# Patient Record
Sex: Female | Born: 1980 | Hispanic: Yes | Marital: Single | Smoking: Never smoker
Health system: Southern US, Community
[De-identification: ages and names within clinical notes are randomized; demographics above are authoritative.]

## PROBLEM LIST (undated history)

## (undated) DIAGNOSIS — E119 Type 2 diabetes mellitus without complications: Secondary | ICD-10-CM

---

## 2018-04-19 ENCOUNTER — Emergency Department (HOSPITAL_COMMUNITY)
Admission: EM | Admit: 2018-04-19 | Discharge: 2018-04-19 | Disposition: A | Discharge status: Home / Self Care | Payer: Self-pay | Attending: Emergency Medicine | Admitting: Emergency Medicine

## 2018-04-19 ENCOUNTER — Encounter (HOSPITAL_COMMUNITY): Payer: Self-pay

## 2018-04-19 DIAGNOSIS — R05 Cough: Secondary | ICD-10-CM | POA: Insufficient documentation

## 2018-04-19 DIAGNOSIS — B9789 Other viral agents as the cause of diseases classified elsewhere: Secondary | ICD-10-CM

## 2018-04-19 DIAGNOSIS — J069 Acute upper respiratory infection, unspecified: Secondary | ICD-10-CM | POA: Insufficient documentation

## 2018-04-19 DIAGNOSIS — B309 Viral conjunctivitis, unspecified: Secondary | ICD-10-CM | POA: Insufficient documentation

## 2018-04-19 DIAGNOSIS — R070 Pain in throat: Secondary | ICD-10-CM | POA: Insufficient documentation

## 2018-04-19 LAB — GROUP A STREP BY PCR: GROUP A STREP BY PCR: NOT DETECTED

## 2018-04-19 MED ORDER — ERYTHROMYCIN 5 MG/GM OP OINT: TOPICAL_OINTMENT | OPHTHALMIC | 0 refills | Status: AC

## 2018-04-19 MED ORDER — GUAIFENESIN 100 MG/5ML PO SYRP: 100.0000 mg | ORAL_SOLUTION | ORAL | 0 refills | Status: AC | PRN

## 2018-04-19 NOTE — ED Triage Notes (Signed)
Pt presents with c/o bilateral eye drainage and redness as well as burning in her esophagus. Pt reports she feels a lot of burning in her chest. She said she feels like she has acid in her throat and is unable to keep anything down. Pt reports no abdominal pain, says it is only in her esophagus and throat.

## 2018-04-19 NOTE — Discharge Instructions (Signed)
Le he recetado medicina para sus ojos, apliquela todos los dias por 7 dias. Tambien le he dado una receta para la tos. Si sus simptomas empeoran o siente que le falta el aire porfavor regrese a Radio broadcast assistant.

## 2018-04-19 NOTE — ED Provider Notes (Signed)
[facility]-EMERGENCY DEPT Provider Note   CSN: [id number] Arrival date & time: 04/19/18  1240     History   Chief Complaint Chief Complaint  Patient presents with  . Eye Drainage  . Throat pain    HPI Sara Woodward is a 38 y.o. female.  38 y.o female with no PMH to the ED with a chief complaint of burning sore throat, eye drainage x3 days.  Patient reports feeling a burning sensation in her throat that radiates down her esophagus worse with drinking or eating.  She also endorses some bilateral eye drainage which began 3 days ago, reports waking up with yellow to green discharge daily.  She has tried some Benadryl to help with her symptoms along with NyQuil and Mucinex.  Reports no relieving symptoms at this time.  Currently has a newborn infant at home.  Denies any shortness of breath, chest pain, back pain but reports body aches.  Denies getting a flu shot this season.     History reviewed. No pertinent past medical history.  There are no active problems to display for this patient.   History reviewed. No pertinent surgical history.   OB History   No obstetric history on file.      Home Medications    Prior to Admission medications   Medication Sig Start Date End Date Taking? Authorizing Provider  erythromycin ophthalmic ointment Place a 1/2 inch ribbon of ointment into the lower eyelid. 04/19/18   Claude MangesSoto, Robina Hamor, PA-C  guaifenesin (ROBITUSSIN) 100 MG/5ML syrup Take 5-10 mLs (100-200 mg total) by mouth every 4 (four) hours as needed for up to 5 days for cough. 04/19/18 04/24/18  Claude MangesSoto, Marliss Buttacavoli, PA-C    Family History History reviewed. No pertinent family history.  Social History Social History   Tobacco Use  . Smoking status: Never Smoker  . Smokeless tobacco: Never Used  Substance Use Topics  . Alcohol use: Never    Frequency: Never  . Drug use: Never     Allergies   Patient has no known allergies.   Review of Systems Review of  Systems  HENT: Positive for sore throat. Negative for rhinorrhea.   Eyes: Positive for discharge and redness.  Respiratory: Negative for shortness of breath.   Cardiovascular: Negative for chest pain.     Physical Exam Updated Vital Signs BP (!) 136/91 (BP Location: Left Arm)   Pulse 90   Temp 98.6 F (37 C)   Resp 18   Ht 5\' 5"  (1.651 m)   Wt 86.2 kg   LMP 04/04/2018 (Approximate)   SpO2 100%   BMI 31.62 kg/m   Physical Exam Vitals signs and nursing note reviewed.  Constitutional:      General: She is not in acute distress.    Appearance: She is well-developed.  HENT:     Head: Normocephalic and atraumatic.     Mouth/Throat:     Pharynx: Oropharynx is clear. Uvula midline. Posterior oropharyngeal erythema present. No pharyngeal swelling or oropharyngeal exudate.     Tonsils: Tonsillar exudate present. Swelling: 1+ on the right. 1+ on the left.     Comments: Bilateral tonsillar exudate along with erythema.  No tonsillar abscess or uvula swelling. Eyes:     Conjunctiva/sclera:     Right eye: Right conjunctiva is injected. No exudate.    Left eye: Left conjunctiva is injected. No exudate.    Pupils: Pupils are equal, round, and reactive to light.     Comments: Bilateral eye drainage,  injected bilaterally, clear drainage from both eyes.  Neck:     Musculoskeletal: Normal range of motion.  Cardiovascular:     Rate and Rhythm: Regular rhythm.     Heart sounds: Normal heart sounds.  Pulmonary:     Effort: Pulmonary effort is normal. No respiratory distress.     Breath sounds: Normal breath sounds.  Abdominal:     General: Bowel sounds are normal. There is no distension.     Palpations: Abdomen is soft.     Tenderness: There is no abdominal tenderness.  Musculoskeletal:        General: No tenderness or deformity.     Right lower leg: No edema.     Left lower leg: No edema.  Skin:    General: Skin is warm and dry.  Neurological:     Mental Status: She is alert and  oriented to person, place, and time.      Visual Acuity  Right Eye Distance:   Left Eye Distance:   Bilateral Distance:    Right Eye Near: R Near: 20/50 Left Eye Near:  L Near: 20/50 Bilateral Near:  20/50   ED Treatments / Results  Labs (all labs ordered are listed, but only abnormal results are displayed) Labs Reviewed  GROUP A STREP BY PCR    EKG None  Radiology No results found.  Procedures Procedures (including critical care time)  Medications Ordered in ED Medications - No data to display   Initial Impression / Assessment and Plan / ED Course  I have reviewed the triage vital signs and the nursing notes.  Pertinent labs & imaging results that were available during my care of the patient were reviewed by me and considered in my medical decision making (see chart for details).    Presents with a viral URI symptoms, which began a couple days ago.  Reports trying to treated with over-the-counter medication but states no relieving symptoms.  Patient has now developed a bilateral conjunctivitis, likely viral in species but she would like some ointment cream to apply to her eyes at this time will prescribe her some erythromycin ointments, along with some with penicillin Robitussin to help with her cough and sore throat.  PCR was negative for strep pharyngitis. Her vitals have been stable during visit, slight elevation on heart rate but reports taking Mucinex prior to arrival.   Final Clinical Impressions(s) / ED Diagnoses   Final diagnoses:  Viral conjunctivitis  Viral URI with cough    ED Discharge Orders         Ordered    erythromycin ophthalmic ointment     04/19/18 1609    guaifenesin (ROBITUSSIN) 100 MG/5ML syrup  Every 4 hours PRN     04/19/18 1611           Claude Manges, PA-C 04/19/18 1622    Derwood Kaplan, MD 04/20/18 [phone]

## 2018-11-10 ENCOUNTER — Emergency Department (HOSPITAL_COMMUNITY)
Admission: EM | Admit: 2018-11-10 | Discharge: 2018-11-10 | Disposition: A | Discharge status: Home / Self Care | Payer: HRSA Program | Attending: Emergency Medicine | Admitting: Emergency Medicine

## 2018-11-10 ENCOUNTER — Encounter (HOSPITAL_COMMUNITY): Payer: Self-pay | Admitting: Emergency Medicine

## 2018-11-10 ENCOUNTER — Other Ambulatory Visit: Payer: Self-pay

## 2018-11-10 ENCOUNTER — Emergency Department (HOSPITAL_COMMUNITY): Payer: HRSA Program

## 2018-11-10 DIAGNOSIS — R110 Nausea: Secondary | ICD-10-CM | POA: Insufficient documentation

## 2018-11-10 DIAGNOSIS — B349 Viral infection, unspecified: Secondary | ICD-10-CM

## 2018-11-10 DIAGNOSIS — Z20828 Contact with and (suspected) exposure to other viral communicable diseases: Secondary | ICD-10-CM | POA: Insufficient documentation

## 2018-11-10 DIAGNOSIS — R1031 Right lower quadrant pain: Secondary | ICD-10-CM | POA: Diagnosis not present

## 2018-11-10 DIAGNOSIS — R0789 Other chest pain: Secondary | ICD-10-CM | POA: Insufficient documentation

## 2018-11-10 DIAGNOSIS — Z20822 Contact with and (suspected) exposure to covid-19: Secondary | ICD-10-CM

## 2018-11-10 DIAGNOSIS — R0602 Shortness of breath: Secondary | ICD-10-CM | POA: Diagnosis present

## 2018-11-10 LAB — I-STAT BETA HCG BLOOD, ED (MC, WL, AP ONLY): I-stat hCG, quantitative: <5 m[IU]/mL (ref <5)

## 2018-11-10 LAB — CBC
HCT: 45 % (ref 36.0–46.0)
Hemoglobin: 15.2 g/dL — ABNORMAL HIGH (ref 12.0–15.0)
MCH: 30 pg (ref 26.0–34.0)
MCHC: 33.8 g/dL (ref 30.0–36.0)
MCV: 88.9 fL (ref 80.0–100.0)
Platelets: 175 10*3/uL (ref 150–400)
RBC: 5.06 MIL/uL (ref 3.87–5.11)
RDW: 11.7 % (ref 11.5–15.5)
WBC: 6.6 10*3/uL (ref 4.0–10.5)
nRBC: 0 % (ref 0.0–0.2)

## 2018-11-10 LAB — URINALYSIS, ROUTINE W REFLEX MICROSCOPIC
Bilirubin Urine: NEGATIVE
Glucose, UA: >=500 mg/dL — AB
Hgb urine dipstick: NEGATIVE
Ketones, ur: NEGATIVE mg/dL
Leukocytes,Ua: NEGATIVE
Nitrite: NEGATIVE
Protein, ur: NEGATIVE mg/dL
Specific Gravity, Urine: 1.025 (ref 1.005–1.030)
pH: 5 (ref 5.0–8.0)

## 2018-11-10 LAB — COMPREHENSIVE METABOLIC PANEL
ALT: 32 U/L (ref 0–44)
AST: 27 U/L (ref 15–41)
Albumin: 4.1 g/dL (ref 3.5–5.0)
Alkaline Phosphatase: 95 U/L (ref 38–126)
Anion gap: 10 (ref 5–15)
BUN: 7 mg/dL (ref 6–20)
CO2: 23 mmol/L (ref 22–32)
Calcium: 9.2 mg/dL (ref 8.9–10.3)
Chloride: 100 mmol/L (ref 98–111)
Creatinine, Ser: 0.44 mg/dL (ref 0.44–1.00)
GFR calc Af Amer: >60 mL/min (ref >60)
GFR calc non Af Amer: >60 mL/min (ref >60)
Glucose, Bld: 302 mg/dL — ABNORMAL HIGH (ref 70–99)
Potassium: 4.1 mmol/L (ref 3.5–5.1)
Sodium: 133 mmol/L — ABNORMAL LOW (ref 135–145)
Total Bilirubin: 1.2 mg/dL (ref 0.3–1.2)
Total Protein: 7.1 g/dL (ref 6.5–8.1)

## 2018-11-10 LAB — LIPASE, BLOOD: Lipase: 24 U/L (ref 11–51)

## 2018-11-10 MED ORDER — SODIUM CHLORIDE 0.9% FLUSH: 3.0000 mL | Freq: Once | INTRAVENOUS | Status: DC

## 2018-11-10 MED ORDER — IOHEXOL 300 MG/ML  SOLN
100.0000 mL | Freq: Once | INTRAMUSCULAR | Status: AC | PRN
  Administered 2018-11-10: 100 mL via INTRAVENOUS

## 2018-11-10 NOTE — ED Notes (Signed)
Patient transported to CT 

## 2018-11-10 NOTE — ED Triage Notes (Signed)
Pt reports 2 weeks of SOB, also c/o right side/back pain. Denies recent nausea/vomiting/fever.

## 2018-11-10 NOTE — ED Provider Notes (Signed)
[facility] EMERGENCY [facility] Provider Note   CSN: [id number] Arrival date & time: 11/10/18  1414    History   Chief Complaint Chief Complaint  Patient presents with  . Shortness of Breath  . Abdominal Pain    HPI Sara Woodward is a 38 y.o. female.     HPI Patient presents from home for evaluation of abdominal pain.  She reports that her symptoms actually started 2 weeks ago when she had 1 week of shortness of breath and loss of smell.  Over the last week she has felt a "heaviness" in her chest without clear cough.  Denies fevers or chills.  Has had several episodes of diarrhea daily over the last 2 weeks as well.  Last night she developed right-sided mild achy abdominal pain that progressed overnight and today to become a severe sharp sensation.  She reports that is much worse when she moves, pushes on it, or walks.  Endorses nausea but no vomiting.  Has had bowel movement today.   History reviewed. No pertinent past medical history.  There are no active problems to display for this patient.   History reviewed. No pertinent surgical history.   OB History   No obstetric history on file.      Home Medications    Prior to Admission medications   Medication Sig Start Date End Date Taking? Authorizing Provider  erythromycin ophthalmic ointment Place a 1/2 inch ribbon of ointment into the lower eyelid. 04/19/18   Claude MangesSoto, Johana, PA-C    Family History History reviewed. No pertinent family history.  Social History Social History   Tobacco Use  . Smoking status: Never Smoker  . Smokeless tobacco: Never Used  Substance Use Topics  . Alcohol use: Never    Frequency: Never  . Drug use: Never     Allergies   Patient has no known allergies.   Review of Systems Review of Systems  Constitutional: Negative for chills and fever.  HENT:       Loss of smell  Eyes: Negative for pain and visual disturbance.  Respiratory: Positive for shortness of  breath. Negative for cough.   Cardiovascular: Negative for palpitations.  Gastrointestinal: Positive for abdominal pain, diarrhea and nausea. Negative for vomiting.  Genitourinary: Negative for dysuria and hematuria.  Musculoskeletal: Negative for arthralgias and back pain.  Skin: Negative for color change and rash.  Neurological: Negative for seizures and syncope.  All other systems reviewed and are negative.    Physical Exam Updated Vital Signs BP 112/88 (BP Location: Left Arm)   Pulse 77   Temp 98.2 F (36.8 C) (Oral)   Resp 17   Ht 5\' 5"  (1.651 m)   Wt 86.2 kg   LMP  (LMP Unknown)   SpO2 97%   BMI 31.62 kg/m   Physical Exam Vitals signs and nursing note reviewed.  Constitutional:      General: She is not in acute distress.    Appearance: She is well-developed.  HENT:     Head: Normocephalic and atraumatic.  Eyes:     Conjunctiva/sclera: Conjunctivae normal.  Neck:     Musculoskeletal: Neck supple.  Cardiovascular:     Rate and Rhythm: Normal rate and regular rhythm.     Heart sounds: No murmur.  Pulmonary:     Effort: Pulmonary effort is normal. No respiratory distress.     Breath sounds: Normal breath sounds.  Abdominal:     Palpations: Abdomen is soft.     Tenderness:  There is abdominal tenderness. There is guarding.     Comments: RLQ tenderness  Skin:    General: Skin is warm and dry.  Neurological:     Mental Status: She is alert and oriented to person, place, and time.  Psychiatric:        Mood and Affect: Mood normal.      ED Treatments / Results  Labs (all labs ordered are listed, but only abnormal results are displayed) Labs Reviewed  COMPREHENSIVE METABOLIC PANEL - Abnormal; Notable for the following components:      Result Value   Sodium 133 (*)    Glucose, Bld 302 (*)    All other components within normal limits  CBC - Abnormal; Notable for the following components:   Hemoglobin 15.2 (*)    All other components within normal limits   URINALYSIS, ROUTINE W REFLEX MICROSCOPIC - Abnormal; Notable for the following components:   Glucose, UA >=500 (*)    Bacteria, UA RARE (*)    All other components within normal limits  SARS CORONAVIRUS 2  LIPASE, BLOOD  I-STAT BETA HCG BLOOD, ED ([facility], [facility], [facility] ONLY)    EKG EKG Interpretation  Date/Time:  Thursday November 10 2018 18:34:26 EDT Ventricular Rate:  75 PR Interval:    QRS Duration: 89 QT Interval:  396 QTC Calculation: 443 R Axis:   26 Text Interpretation:  Sinus rhythm No old tracing to compare Confirmed by Melene PlanFloyd, Dan [phone]([id number]) on 11/10/2018 6:41:27 PM   Radiology Dg Chest 2 View  Result Date: 11/10/2018 CLINICAL DATA:  Shortness of breath.  Chest pressure EXAM: CHEST - 2 VIEW COMPARISON:  None. FINDINGS: Lungs are clear. Heart size and pulmonary vascularity are normal. No adenopathy. No bone lesions. No pneumothorax. IMPRESSION: No edema or consolidation. Electronically Signed   By: Bretta BangWilliam  Woodruff III M.D.   On: 11/10/2018 15:21   Ct Abdomen Pelvis W Contrast  Result Date: 11/10/2018 CLINICAL DATA:  Abdominal pain, fever, assess for right lower quadrant abscess EXAM: CT ABDOMEN AND PELVIS WITH CONTRAST TECHNIQUE: Multidetector CT imaging of the abdomen and pelvis was performed using the standard protocol following bolus administration of intravenous contrast. CONTRAST:  100mL OMNIPAQUE IOHEXOL 300 MG/ML  SOLN COMPARISON:  None. FINDINGS: Lower chest: Lung bases are clear. Normal heart size. No pericardial effusion. Hepatobiliary: No focal liver abnormality is seen. No gallstones, gallbladder wall thickening, or biliary dilatation. Pancreas: Unremarkable. No pancreatic ductal dilatation or surrounding inflammatory changes. Spleen: Borderline splenomegaly measuring up to 13 cm. No focal splenic abnormality. Adrenals/Urinary Tract: Adrenal glands are unremarkable. Kidneys are normal, without renal calculi, focal lesion, or hydronephrosis. Mild circumferential bladder wall thickening  greater than expected for the degree of distention. Stomach/Bowel: Distal esophagus, stomach and duodenal sweep are unremarkable. No bowel wall thickening or dilatation. No evidence of obstruction. A normal appendix is visualized. Scattered colonic diverticula without focal pericolonic inflammation to suggest diverticulitis. Vascular/Lymphatic: The aorta is normal caliber. No suspicious or enlarged lymph nodes in the included lymphatic chains. Reproductive: The uterus is retroflexed. Radiopaque IUD in expected position. Punctate myometrial calcification, nonspecific though almost certainly benign. Normal follicles. Other: No abdominopelvic free fluid or free gas. No bowel containing hernias. Fat containing umbilical hernia with a 9 x 9 mm fascial defect. Small fat containing inguinal hernias. Musculoskeletal: No acute osseous abnormality or suspicious osseous lesion. IMPRESSION: 1. Mild circumferential bladder wall thickening greater than expected for the degree of distention. Correlate with urinalysis to exclude cystitis. 2. No other acute intra-abdominal process. Specifically,  normal appendix and no right lower quadrant abscess. 3. Borderline splenomegaly measuring up to 13 cm. Electronically Signed   By: Lovena Le M.D.   On: 11/10/2018 21:06    Procedures Procedures (including critical care time)  Medications Ordered in ED Medications  iohexol (OMNIPAQUE) 300 MG/ML solution 100 mL (100 mLs Intravenous Contrast Given 11/10/18 2031)     Initial Impression / Assessment and Plan / ED Course  I have reviewed the triage vital signs and the nursing notes.  Pertinent labs & imaging results that were available during my care of the patient were reviewed by me and considered in my medical decision making (see chart for details).        Ms. Biffle is a 38 year old female with no chronic medical problems.  Here today with multiple complaints as above.  Overall, work-up is unremarkable for acute  bacterial infection of the abdomen, which is what I was most concerned about.  Her chest x-ray is clear.  No significant electrolyte abnormality.  She did have markedly elevated glucose.  This will need to be repeated by her PCP and she may have diabetes/prediabetes.  Normal CBC.  She is not pregnant.  UA is not suggestive of infection.  Overall I reassessed her and her vitals are normal.  She is comfortable ambulating and I have low suspicion of serious infection with CT as above.  I do think it is very likely that she has coronavirus infection which may be the cause of her diarrhea, which may in turn be the cause of some of her pain.  I have ordered testing upon discharge.  No other indication for further observation or emergent evaluation as she has no new oxygen requirement, satting well, without increased work of breathing.  Patient vocalized understanding and agreement with plan. Hand no other questions or concerns. Was given relevant verbal and written information regarding aftercare and return precautions and discharged in good condition.    Sara Woodward was evaluated in Emergency Department on 11/11/2018 for the symptoms described in the history of present illness. She was evaluated in the context of the global COVID-19 pandemic, which necessitated consideration that the patient might be at risk for infection with the SARS-CoV-2 virus that causes COVID-19. Institutional protocols and algorithms that pertain to the evaluation of patients at risk for COVID-19 are in a state of rapid change based on information released by regulatory bodies including the CDC and federal and state organizations. These policies and algorithms were followed during the patient's care in the ED.   Final Clinical Impressions(s) / ED Diagnoses   Final diagnoses:  Acute viral syndrome  Suspected Covid-19 Virus Infection  Right lower quadrant abdominal pain    ED Discharge Orders    None       Tillie Fantasia,  MD 11/11/18 0103    Deno Etienne, DO 11/11/18 4481

## 2018-11-10 NOTE — ED Notes (Signed)
Called CT, informed them Pt has IV and is read for CT

## 2018-11-11 LAB — SARS CORONAVIRUS 2 (TAT 6-24 HRS): SARS Coronavirus 2: NEGATIVE

## 2018-11-13 ENCOUNTER — Telehealth: Payer: Self-pay

## 2018-11-13 NOTE — Telephone Encounter (Signed)
Called number listed and unable to LVM

## 2019-08-05 ENCOUNTER — Other Ambulatory Visit: Payer: Self-pay

## 2019-08-05 ENCOUNTER — Emergency Department (HOSPITAL_COMMUNITY): Payer: Self-pay

## 2019-08-05 ENCOUNTER — Encounter: Payer: Self-pay | Admitting: Emergency Medicine

## 2019-08-05 ENCOUNTER — Emergency Department (HOSPITAL_COMMUNITY)
Admission: EM | Admit: 2019-08-05 | Discharge: 2019-08-05 | Disposition: A | Discharge status: Home / Self Care | Payer: Self-pay | Attending: Emergency Medicine | Admitting: Emergency Medicine

## 2019-08-05 DIAGNOSIS — Z8616 Personal history of COVID-19: Secondary | ICD-10-CM | POA: Insufficient documentation

## 2019-08-05 DIAGNOSIS — W11XXXD Fall on and from ladder, subsequent encounter: Secondary | ICD-10-CM | POA: Insufficient documentation

## 2019-08-05 DIAGNOSIS — R739 Hyperglycemia, unspecified: Secondary | ICD-10-CM | POA: Insufficient documentation

## 2019-08-05 DIAGNOSIS — Z6830 Body mass index (BMI) 30.0-30.9, adult: Secondary | ICD-10-CM | POA: Insufficient documentation

## 2019-08-05 DIAGNOSIS — E669 Obesity, unspecified: Secondary | ICD-10-CM | POA: Insufficient documentation

## 2019-08-05 DIAGNOSIS — W19XXXA Unspecified fall, initial encounter: Secondary | ICD-10-CM

## 2019-08-05 DIAGNOSIS — J45901 Unspecified asthma with (acute) exacerbation: Secondary | ICD-10-CM | POA: Insufficient documentation

## 2019-08-05 DIAGNOSIS — R10812 Left upper quadrant abdominal tenderness: Secondary | ICD-10-CM | POA: Insufficient documentation

## 2019-08-05 LAB — URINALYSIS, ROUTINE W REFLEX MICROSCOPIC
Bacteria, UA: NONE SEEN
Bilirubin Urine: NEGATIVE
Glucose, UA: >=500 mg/dL — AB
Hgb urine dipstick: NEGATIVE
Ketones, ur: NEGATIVE mg/dL
Leukocytes,Ua: NEGATIVE
Nitrite: NEGATIVE
Protein, ur: NEGATIVE mg/dL
Specific Gravity, Urine: 1.027 (ref 1.005–1.030)
pH: 6 (ref 5.0–8.0)

## 2019-08-05 LAB — I-STAT CHEM 8, ED
BUN: 18 mg/dL (ref 6–20)
Calcium, Ion: 1.18 mmol/L (ref 1.15–1.40)
Chloride: 97 mmol/L — ABNORMAL LOW (ref 98–111)
Creatinine, Ser: 0.3 mg/dL — ABNORMAL LOW (ref 0.44–1.00)
Glucose, Bld: 373 mg/dL — ABNORMAL HIGH (ref 70–99)
HCT: 44 % (ref 36.0–46.0)
Hemoglobin: 15 g/dL (ref 12.0–15.0)
Potassium: 4.3 mmol/L (ref 3.5–5.1)
Sodium: 132 mmol/L — ABNORMAL LOW (ref 135–145)
TCO2: 28 mmol/L (ref 22–32)

## 2019-08-05 LAB — I-STAT BETA HCG BLOOD, ED (MC, WL, AP ONLY): I-stat hCG, quantitative: <5 m[IU]/mL (ref <5)

## 2019-08-05 LAB — PREGNANCY, URINE: Preg Test, Ur: NEGATIVE

## 2019-08-05 MED ORDER — SODIUM CHLORIDE 0.9 % IV BOLUS
1000.0000 mL | Freq: Once | INTRAVENOUS | Status: AC
  Administered 2019-08-05: 1000 mL via INTRAVENOUS

## 2019-08-05 MED ORDER — AEROCHAMBER PLUS FLO-VU MEDIUM MISC
1.0000 | Freq: Once | Status: AC
  Administered 2019-08-05: 1
  Filled 2019-08-05: qty 1

## 2019-08-05 MED ORDER — METFORMIN HCL 500 MG PO TABS
500.0000 mg | ORAL_TABLET | Freq: Two times a day (BID) | ORAL | 0 refills | Status: DC
Start: 2019-08-05 — End: 2020-04-28

## 2019-08-05 MED ORDER — IOHEXOL 300 MG/ML  SOLN
100.0000 mL | Freq: Once | INTRAMUSCULAR | Status: AC | PRN
  Administered 2019-08-05: 100 mL via INTRAVENOUS

## 2019-08-05 MED ORDER — PREDNISONE 20 MG PO TABS
40.0000 mg | ORAL_TABLET | Freq: Once | ORAL | Status: AC
  Administered 2019-08-05: 40 mg via ORAL
  Filled 2019-08-05: qty 2

## 2019-08-05 MED ORDER — ALBUTEROL SULFATE HFA 108 (90 BASE) MCG/ACT IN AERS
2.0000 | INHALATION_SPRAY | RESPIRATORY_TRACT | Status: DC | PRN
  Administered 2019-08-05: 2 via RESPIRATORY_TRACT
  Filled 2019-08-05: qty 6.7

## 2019-08-05 MED ORDER — MUCINEX 600 MG PO TB12: 600.0000 mg | ORAL_TABLET | Freq: Two times a day (BID) | ORAL | 0 refills | Status: AC | PRN

## 2019-08-05 NOTE — ED Notes (Signed)
Patient verbalizes understanding of discharge instructions. Opportunity for questioning and answers were provided. Armband removed by staff, pt discharged from ED ambulatory.   

## 2019-08-05 NOTE — ED Notes (Signed)
CT aware urine preg is negative

## 2019-08-05 NOTE — ED Triage Notes (Signed)
Pt fell backwards off a ladder (Approx 7 steps up) at work 10-12 days ago.  States she landed on her back.  Denies LOC.  Reports SOB since fall and pain across upper back.  C/o dizziness since yesterday.

## 2019-08-05 NOTE — ED Notes (Signed)
Lab will add on urine preg

## 2019-08-05 NOTE — ED Provider Notes (Signed)
Care transferred to me.  Patient's work-up is unremarkable besides hyperglycemia.  CT images and results reviewed and are unremarkable besides known umbilical hernia.  Dizziness is better after fluids and albuterol has helped her shortness of breath sound.  Given the hyperglycemia and questionable asthma history, will hold off on outpatient steroids.  She will be given the inhaler and will prescribe her Metformin as she is close to running out.  Discussed importance of following up with a PCP.   Pricilla Loveless, MD 08/05/19 1807

## 2019-08-05 NOTE — Discharge Instructions (Signed)
You may use the albuterol inhaler 2 puffs every 4 hours as needed for cough or shortness of breath.  If you develop coughing up blood, fever, shortness of breath, chest pain, or any other new/concerning symptoms then return to the ER for evaluation.  It is very important for you to follow-up with a family doctor for long-term care.

## 2019-08-05 NOTE — ED Provider Notes (Signed)
[facility] EMERGENCY DEPARTMENT Provider Note   CSN: [id number] Arrival date & time: 08/05/19  1211     History Chief Complaint  Patient presents with  . Fall  . Shortness of Breath  . Dizziness    Sara Woodward is a 39 y.o. female.  HPI    39 year old female who fell off a ladder 12 days ago.  She states she landed on her back.  She did not strike her head or lose consciousness.  She reports that she has been somewhat short of breath since the fall with some pain across her upper back.  She also had some cough and wheezing.  She has a history of asthma and has been using albuterol without relief.  She reports some lightheadedness.  She denies any fever, productive cough, or Covid exposures.  She had Covid last year and has had her vaccines since then. History reviewed. No pertinent past medical history.  There are no problems to display for this patient.   History reviewed. No pertinent surgical history.   OB History   No obstetric history on file.     No family history on file.  Social History   Tobacco Use  . Smoking status: Never Smoker  . Smokeless tobacco: Never Used  Substance Use Topics  . Alcohol use: Never  . Drug use: Never    Home Medications Prior to Admission medications   Medication Sig Start Date End Date Taking? Authorizing Provider  erythromycin ophthalmic ointment Place a 1/2 inch ribbon of ointment into the lower eyelid. 04/19/18   Claude Manges, PA-C    Allergies    Patient has no known allergies.  Review of Systems   Review of Systems  All other systems reviewed and are negative.   Physical Exam Updated Vital Signs BP 117/76 (BP Location: Left Arm)   Pulse 86   Temp 98.1 F (36.7 C) (Oral)   Resp 16   Ht 1.676 m (5\' 6" )   Wt 86.2 kg   LMP 07/29/2019   SpO2 99%   BMI 30.67 kg/m   Physical Exam Vitals and nursing note reviewed.  Constitutional:      General: She is not in acute distress.  Appearance: She is well-developed. She is obese. She is not ill-appearing.  HENT:     Head: Normocephalic.     Mouth/Throat:     Mouth: Mucous membranes are moist.  Eyes:     Pupils: Pupils are equal, round, and reactive to light.  Cardiovascular:     Rate and Rhythm: Normal rate and regular rhythm.  Pulmonary:     Effort: Pulmonary effort is normal.     Breath sounds: Examination of the right-middle field reveals wheezing. Examination of the left-middle field reveals wheezing. Examination of the right-lower field reveals wheezing. Examination of the left-lower field reveals wheezing. Wheezing present.  Abdominal:     General: Bowel sounds are normal.     Palpations: Abdomen is soft.     Tenderness: There is abdominal tenderness.     Comments: Left upper quadrant tenderness to palpation  Musculoskeletal:        General: Normal range of motion.     Cervical back: Normal range of motion.  Skin:    General: Skin is warm and dry.     Capillary Refill: Capillary refill takes less than 2 seconds.  Neurological:     General: No focal deficit present.     Mental Status: She is alert.  Psychiatric:  Mood and Affect: Mood normal.     ED Results / Procedures / Treatments   Labs (all labs ordered are listed, but only abnormal results are displayed) Labs Reviewed  URINALYSIS, ROUTINE W REFLEX MICROSCOPIC - Abnormal; Notable for the following components:      Result Value   Color, Urine COLORLESS (*)    Glucose, UA >=500 (*)    All other components within normal limits  I-STAT CHEM 8, ED - Abnormal; Notable for the following components:   Sodium 132 (*)    Chloride 97 (*)    Creatinine, Ser 0.30 (*)    Glucose, Bld 373 (*)    All other components within normal limits  PREGNANCY, URINE  I-STAT BETA HCG BLOOD, ED ([facility], [facility], [facility] ONLY)    EKG EKG Interpretation  Date/Time:  Saturday Aug 05 2019 12:18:53 EDT Ventricular Rate:  91 PR Interval:  156 QRS Duration: 80 QT  Interval:  358 QTC Calculation: 440 R Axis:   21 Text Interpretation: Normal sinus rhythm Normal ECG Confirmed by Pattricia Boss [phone]) on 08/05/2019 1:39:57 PM   Radiology DG Chest 2 View  Result Date: 08/05/2019 CLINICAL DATA:  Pt fell backwards off a ladder (Approx 7 steps up) at work 10-12 days ago. States she landed on her back. Denies LOC. Reports SOB since fall and pain across upper back. C/o dizziness since yesterday. EXAM: CHEST - 2 VIEW COMPARISON:  Chest radiograph 11/10/2018 FINDINGS: The heart size and mediastinal contours are within normal limits. Lungs are clear. No pneumothorax or pleural effusion. The visualized skeletal structures are unremarkable. IMPRESSION: No acute cardiopulmonary process. Electronically Signed   By: Audie Pinto M.D.   On: 08/05/2019 14:11    Procedures Procedures (including critical care time)  Medications Ordered in ED Medications  albuterol (VENTOLIN HFA) 108 (90 Base) MCG/ACT inhaler 2 puff (has no administration in time range)  AeroChamber Plus Flo-Vu Medium MISC 1 each (has no administration in time range)  predniSONE (DELTASONE) tablet 40 mg (40 mg Oral Given 08/05/19 1400)    ED Course  I have reviewed the triage vital signs and the nursing notes.  Pertinent labs & imaging results that were available during my care of the patient were reviewed by me and considered in my medical decision making (see chart for details).    MDM Rules/Calculators/A&P                      Prednisone and albuterol given here in the ED. Chest x-Linsay Vogt is clear Patient with CT abdomen pending With Dr. Regenia Skeeter and plan discharge if CT abdomen is without acute abnormality. Final Clinical Impression(s) / ED Diagnoses Final diagnoses:  Mild asthma with exacerbation, unspecified whether persistent  Fall, initial encounter  Hyperglycemia    Rx / DC Orders ED Discharge Orders    None       Pattricia Boss, MD 08/05/19 [phone]

## 2019-11-12 ENCOUNTER — Encounter (HOSPITAL_COMMUNITY): Payer: Self-pay | Admitting: Emergency Medicine

## 2019-11-12 ENCOUNTER — Emergency Department (HOSPITAL_COMMUNITY)
Admission: EM | Admit: 2019-11-12 | Discharge: 2019-11-13 | Disposition: A | Discharge status: Home / Self Care | Payer: Self-pay | Attending: Emergency Medicine | Admitting: Emergency Medicine

## 2019-11-12 ENCOUNTER — Emergency Department (HOSPITAL_COMMUNITY): Payer: Self-pay

## 2019-11-12 ENCOUNTER — Other Ambulatory Visit: Payer: Self-pay

## 2019-11-12 DIAGNOSIS — R0602 Shortness of breath: Secondary | ICD-10-CM | POA: Insufficient documentation

## 2019-11-12 DIAGNOSIS — Y999 Unspecified external cause status: Secondary | ICD-10-CM | POA: Insufficient documentation

## 2019-11-12 DIAGNOSIS — E119 Type 2 diabetes mellitus without complications: Secondary | ICD-10-CM | POA: Insufficient documentation

## 2019-11-12 DIAGNOSIS — W19XXXA Unspecified fall, initial encounter: Secondary | ICD-10-CM | POA: Insufficient documentation

## 2019-11-12 DIAGNOSIS — Z7984 Long term (current) use of oral hypoglycemic drugs: Secondary | ICD-10-CM | POA: Insufficient documentation

## 2019-11-12 DIAGNOSIS — Y9289 Other specified places as the place of occurrence of the external cause: Secondary | ICD-10-CM | POA: Insufficient documentation

## 2019-11-12 DIAGNOSIS — R072 Precordial pain: Secondary | ICD-10-CM | POA: Insufficient documentation

## 2019-11-12 DIAGNOSIS — Y939 Activity, unspecified: Secondary | ICD-10-CM | POA: Insufficient documentation

## 2019-11-12 DIAGNOSIS — R61 Generalized hyperhidrosis: Secondary | ICD-10-CM | POA: Insufficient documentation

## 2019-11-12 HISTORY — DX: Type 2 diabetes mellitus without complications: E11.9

## 2019-11-12 NOTE — ED Triage Notes (Signed)
Patient fell backwards while riding a go cart this evening , no LOC/ambulatory reports mid back pain/upper chest pain , respirations unlabored.

## 2019-11-13 LAB — CBC WITH DIFFERENTIAL/PLATELET
Abs Immature Granulocytes: 0.01 10*3/uL (ref 0.00–0.07)
Basophils Absolute: 0 10*3/uL (ref 0.0–0.1)
Basophils Relative: 1 %
Eosinophils Absolute: 0.4 10*3/uL (ref 0.0–0.5)
Eosinophils Relative: 6 %
HCT: 39.4 % (ref 36.0–46.0)
Hemoglobin: 13.2 g/dL (ref 12.0–15.0)
Immature Granulocytes: 0 %
Lymphocytes Relative: 39 %
Lymphs Abs: 2.4 10*3/uL (ref 0.7–4.0)
MCH: 30.2 pg (ref 26.0–34.0)
MCHC: 33.5 g/dL (ref 30.0–36.0)
MCV: 90.2 fL (ref 80.0–100.0)
Monocytes Absolute: 0.5 10*3/uL (ref 0.1–1.0)
Monocytes Relative: 8 %
Neutro Abs: 2.9 10*3/uL (ref 1.7–7.7)
Neutrophils Relative %: 46 %
Platelets: 195 10*3/uL (ref 150–400)
RBC: 4.37 MIL/uL (ref 3.87–5.11)
RDW: 11.5 % (ref 11.5–15.5)
WBC: 6.2 10*3/uL (ref 4.0–10.5)
nRBC: 0 % (ref 0.0–0.2)

## 2019-11-13 LAB — COMPREHENSIVE METABOLIC PANEL
ALT: 32 U/L (ref 0–44)
AST: 22 U/L (ref 15–41)
Albumin: 4 g/dL (ref 3.5–5.0)
Alkaline Phosphatase: 82 U/L (ref 38–126)
Anion gap: 12 (ref 5–15)
BUN: 12 mg/dL (ref 6–20)
CO2: 20 mmol/L — ABNORMAL LOW (ref 22–32)
Calcium: 9 mg/dL (ref 8.9–10.3)
Chloride: 104 mmol/L (ref 98–111)
Creatinine, Ser: 0.54 mg/dL (ref 0.44–1.00)
GFR calc Af Amer: >60 mL/min (ref >60)
GFR calc non Af Amer: >60 mL/min (ref >60)
Glucose, Bld: 217 mg/dL — ABNORMAL HIGH (ref 70–99)
Potassium: 3.7 mmol/L (ref 3.5–5.1)
Sodium: 136 mmol/L (ref 135–145)
Total Bilirubin: 0.3 mg/dL (ref 0.3–1.2)
Total Protein: 6.7 g/dL (ref 6.5–8.1)

## 2019-11-13 LAB — I-STAT BETA HCG BLOOD, ED (MC, WL, AP ONLY): I-stat hCG, quantitative: <5 m[IU]/mL (ref <5)

## 2019-11-13 LAB — LIPASE, BLOOD: Lipase: 26 U/L (ref 11–51)

## 2019-11-13 LAB — TROPONIN I (HIGH SENSITIVITY): Troponin I (High Sensitivity): <2 ng/L (ref <18)

## 2019-11-13 MED ORDER — ACETAMINOPHEN 325 MG PO TABS
650.0000 mg | ORAL_TABLET | Freq: Once | ORAL | Status: AC
  Administered 2019-11-13: 650 mg via ORAL
  Filled 2019-11-13: qty 2

## 2019-11-13 MED ORDER — ASPIRIN 81 MG PO CHEW
324.0000 mg | CHEWABLE_TABLET | Freq: Once | ORAL | Status: AC
  Administered 2019-11-13: 324 mg via ORAL
  Filled 2019-11-13: qty 4

## 2019-11-13 NOTE — Discharge Instructions (Addendum)
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
## 2020-04-28 ENCOUNTER — Emergency Department (HOSPITAL_COMMUNITY): Payer: Self-pay

## 2020-04-28 ENCOUNTER — Emergency Department (HOSPITAL_COMMUNITY)
Admission: EM | Admit: 2020-04-28 | Discharge: 2020-04-28 | Disposition: A | Discharge status: Home / Self Care | Payer: Self-pay | Attending: Emergency Medicine | Admitting: Emergency Medicine

## 2020-04-28 ENCOUNTER — Encounter (HOSPITAL_COMMUNITY): Payer: Self-pay | Admitting: Emergency Medicine

## 2020-04-28 ENCOUNTER — Other Ambulatory Visit: Payer: Self-pay

## 2020-04-28 DIAGNOSIS — E119 Type 2 diabetes mellitus without complications: Secondary | ICD-10-CM | POA: Insufficient documentation

## 2020-04-28 DIAGNOSIS — R42 Dizziness and giddiness: Secondary | ICD-10-CM | POA: Insufficient documentation

## 2020-04-28 DIAGNOSIS — Z20822 Contact with and (suspected) exposure to covid-19: Secondary | ICD-10-CM | POA: Insufficient documentation

## 2020-04-28 DIAGNOSIS — R002 Palpitations: Secondary | ICD-10-CM | POA: Insufficient documentation

## 2020-04-28 DIAGNOSIS — R110 Nausea: Secondary | ICD-10-CM | POA: Insufficient documentation

## 2020-04-28 DIAGNOSIS — R079 Chest pain, unspecified: Secondary | ICD-10-CM | POA: Insufficient documentation

## 2020-04-28 DIAGNOSIS — R0602 Shortness of breath: Secondary | ICD-10-CM | POA: Insufficient documentation

## 2020-04-28 DIAGNOSIS — Z7984 Long term (current) use of oral hypoglycemic drugs: Secondary | ICD-10-CM | POA: Insufficient documentation

## 2020-04-28 DIAGNOSIS — R0789 Other chest pain: Secondary | ICD-10-CM

## 2020-04-28 LAB — CBC
HCT: 43 % (ref 36.0–46.0)
Hemoglobin: 15.3 g/dL — ABNORMAL HIGH (ref 12.0–15.0)
MCH: 31.9 pg (ref 26.0–34.0)
MCHC: 35.6 g/dL (ref 30.0–36.0)
MCV: 89.8 fL (ref 80.0–100.0)
Platelets: 179 10*3/uL (ref 150–400)
RBC: 4.79 MIL/uL (ref 3.87–5.11)
RDW: 11.7 % (ref 11.5–15.5)
WBC: 7.5 10*3/uL (ref 4.0–10.5)
nRBC: 0 % (ref 0.0–0.2)

## 2020-04-28 LAB — I-STAT BETA HCG BLOOD, ED (MC, WL, AP ONLY): I-stat hCG, quantitative: <5 m[IU]/mL (ref <5)

## 2020-04-28 LAB — TROPONIN I (HIGH SENSITIVITY)
Troponin I (High Sensitivity): 3 ng/L (ref <18)
Troponin I (High Sensitivity): 3 ng/L (ref <18)

## 2020-04-28 LAB — BASIC METABOLIC PANEL
Anion gap: 12 (ref 5–15)
BUN: 16 mg/dL (ref 6–20)
CO2: 22 mmol/L (ref 22–32)
Calcium: 9.4 mg/dL (ref 8.9–10.3)
Chloride: 97 mmol/L — ABNORMAL LOW (ref 98–111)
Creatinine, Ser: 0.53 mg/dL (ref 0.44–1.00)
GFR, Estimated: >60 mL/min (ref >60)
Glucose, Bld: 275 mg/dL — ABNORMAL HIGH (ref 70–99)
Potassium: 3.8 mmol/L (ref 3.5–5.1)
Sodium: 131 mmol/L — ABNORMAL LOW (ref 135–145)

## 2020-04-28 LAB — SARS CORONAVIRUS 2 BY RT PCR (HOSPITAL ORDER, PERFORMED IN ~~LOC~~ HOSPITAL LAB): SARS Coronavirus 2: NEGATIVE

## 2020-04-28 MED ORDER — METFORMIN HCL 500 MG PO TABS: 500.0000 mg | ORAL_TABLET | Freq: Two times a day (BID) | ORAL | 0 refills | Status: AC

## 2020-04-28 MED ORDER — METFORMIN HCL 1000 MG PO TABS
1000.0000 mg | ORAL_TABLET | Freq: Two times a day (BID) | ORAL | 0 refills | Status: DC
Start: 2020-04-28 — End: 2020-04-28

## 2020-04-28 MED ORDER — MELOXICAM 7.5 MG PO TABS: 7.5000 mg | ORAL_TABLET | Freq: Every day | ORAL | 0 refills | Status: AC

## 2020-04-28 MED ORDER — OXYCODONE-ACETAMINOPHEN 5-325 MG PO TABS
2.0000 | ORAL_TABLET | Freq: Once | ORAL | Status: AC
  Administered 2020-04-28: 2 via ORAL
  Filled 2020-04-28: qty 2

## 2020-04-28 NOTE — ED Provider Notes (Signed)
Not in room   Margarita Grizzle, MD 04/28/20 [phone]

## 2020-04-28 NOTE — ED Triage Notes (Addendum)
Pt said she got her booster shot and every since then she has been having chest pain and rapid heart rate. Pt said she just does not feel right. Body aches, and not being able to sleep. Pt said her head and eyes are hurting

## 2020-04-28 NOTE — ED Notes (Signed)
Pt educated not to drive after taking oxycodone. Pt said a family member is waiting for her in the car and will drive her home. Pt aware that the oxycodone can make her sleepy and dizzy. Pt refused wheelchair on discharge.She ambulated with steady gait. VSS.

## 2020-04-28 NOTE — ED Provider Notes (Signed)
[facility] EMERGENCY DEPARTMENT Provider Note   CSN: [id number] Arrival date & time: 04/28/20  0043     History Chief Complaint  Patient presents with  . Chest Pain  . Tachycardia    Sara Woodward is a 40 y.o. female.  HPI 40 yo female complaining of fever after booster, and palpitations daily since booster.  Patient states she feels like her heart is going very fast, but has not counted.  She feels lightheaded and nauseated 3-4 x/day.  She has had left chest pain constantly since 2 days after vaccine sharp and severe.  Waxes and wanes and feels worse at night.  Denies and shortness of breath. Fever night after vaccine. IUD in place Ros- sharp pain left leg anterior above knee and left upper arm     Past Medical History:  Diagnosis Date  . Diabetes mellitus without complication (HCC)     There are no problems to display for this patient.   History reviewed. No pertinent surgical history.   OB History   No obstetric history on file.     History reviewed. No pertinent family history.  Social History   Tobacco Use  . Smoking status: Never Smoker  . Smokeless tobacco: Never Used  Substance Use Topics  . Alcohol use: Never  . Drug use: Never    Home Medications Prior to Admission medications   Medication Sig Start Date End Date Taking? Authorizing Provider  erythromycin ophthalmic ointment Place a 1/2 inch ribbon of ointment into the lower eyelid. Patient not taking: No sig reported 04/19/18   Claude Manges, PA-C  guaiFENesin (MUCINEX) 600 MG 12 hr tablet Take 1 tablet (600 mg total) by mouth 2 (two) times daily as needed for cough or to loosen phlegm. Patient not taking: No sig reported 08/05/19   Pricilla Loveless, MD  metFORMIN (GLUCOPHAGE) 500 MG tablet Take 1 tablet (500 mg total) by mouth 2 (two) times daily with a meal. Patient not taking: No sig reported 08/05/19   Pricilla Loveless, MD    Allergies    Patient has no known  allergies.  Review of Systems   Review of Systems  Constitutional: Positive for fatigue and fever. Negative for activity change, appetite change and unexpected weight change.  HENT: Negative.   Eyes: Positive for pain.       Feels like somebody is pushing on eyes  Respiratory: Negative.   Cardiovascular: Positive for chest pain and palpitations.  Gastrointestinal: Positive for constipation, diarrhea, nausea and vomiting.       Feels like she needs to go to bathroom and unable but has diarrhea once per day  Endocrine: Negative.   Genitourinary: Negative.   Musculoskeletal: Negative.   Allergic/Immunologic: Negative.   Neurological: Positive for light-headedness.  Hematological: Negative.   Psychiatric/Behavioral: Negative.   All other systems reviewed and are negative.   Physical Exam Updated Vital Signs BP 124/79   Pulse 80   Temp 98 F (36.7 C)   Resp 15   Ht 1.676 m (5\' 6" )   Wt 86.2 kg   SpO2 97%   BMI 30.67 kg/m   Physical Exam Vitals and nursing note reviewed.  Constitutional:      General: She is not in acute distress.    Appearance: She is well-developed. She is not ill-appearing.  HENT:     Head: Normocephalic and atraumatic.  Eyes:     Extraocular Movements: Extraocular movements intact.     Pupils: Pupils are equal, round, and reactive  to light.  Cardiovascular:     Rate and Rhythm: Normal rate and regular rhythm.  No extrasystoles are present.    Pulses:          Radial pulses are 2+ on the right side and 1+ on the left side.     Heart sounds: Normal heart sounds.  Pulmonary:     Effort: Pulmonary effort is normal.     Breath sounds: Normal breath sounds.  Abdominal:     General: Bowel sounds are normal.     Palpations: Abdomen is soft.  Musculoskeletal:        General: Normal range of motion.     Cervical back: Normal range of motion.  Skin:    General: Skin is warm and dry.     Capillary Refill: Capillary refill takes less than 2 seconds.   Neurological:     General: No focal deficit present.     Mental Status: She is alert.     ED Results / Procedures / Treatments   Labs (all labs ordered are listed, but only abnormal results are displayed) Labs Reviewed  BASIC METABOLIC PANEL - Abnormal; Notable for the following components:      Result Value   Sodium 131 (*)    Chloride 97 (*)    Glucose, Bld 275 (*)    All other components within normal limits  CBC - Abnormal; Notable for the following components:   Hemoglobin 15.3 (*)    All other components within normal limits  SARS CORONAVIRUS 2 BY RT PCR (HOSPITAL ORDER, PERFORMED IN [facility])  I-STAT BETA HCG BLOOD, ED ([facility], [facility], [facility] ONLY)  TROPONIN I (HIGH SENSITIVITY)  TROPONIN I (HIGH SENSITIVITY)    EKG EKG Interpretation  Date/Time:  Sunday April 28 2020 00:54:14 EST Ventricular Rate:  97 PR Interval:  158 QRS Duration: 76 QT Interval:  354 QTC Calculation: 449 R Axis:   1 Text Interpretation: Normal sinus rhythm No significant change since last tracing Confirmed by Margarita Grizzle [phone]) on 04/28/2020 8:07:16 AM   Radiology DG Chest Port 1 View  Result Date: 04/28/2020 CLINICAL DATA:  Chest pain, left arm pain, and rapid heart beat after COVID booster shot on 04/18/2020 EXAM: PORTABLE CHEST 1 VIEW COMPARISON:  11/12/2019 FINDINGS: The heart size and mediastinal contours are within normal limits. Both lungs are clear. The visualized skeletal structures are unremarkable. IMPRESSION: No active disease. Electronically Signed   By: Burman Nieves M.D.   On: 04/28/2020 01:38    Procedures Procedures (including critical care time)  Medications Ordered in ED Medications - No data to display  ED Course  I have reviewed the triage vital signs and the nursing notes.  Pertinent labs & imaging results that were available during my care of the patient were reviewed by me and considered in my medical decision making (see chart for details).     MDM Rules/Calculators/A&P                          DDX- Vaccine related problem including myocarditis- ekg, cxr, and cardiac exam normal, no tachycardia or arhythmia on monitor. Other cardiac abnormality- as above- trop negative x 2, cxr clear, chest wall ttp reproducing pain Patient with history of dm and elevated bs at 275, Final Clinical Impression(s) / ED Diagnoses Final diagnoses:  SOB (shortness of breath)    Rx / DC Orders ED Discharge Orders  Ordered    meloxicam (MOBIC) 7.5 MG tablet  Daily        04/28/20 0826    metFORMIN (GLUCOPHAGE) 1000 MG tablet  2 times daily        04/28/20 0827    metFORMIN (GLUCOPHAGE) 500 MG tablet  2 times daily with meals        04/28/20 0827        No abnormality was found on your work up today except high blood sugar Please restart your metformin You are being started on meloxicam for the chest pain, please stop any other pain medicines such as ibuprofen. Call number on papers and arrange outpatient folllow up asap Return if you are worsening or have new symptoms    Margarita Grizzle, MD 04/28/20 0830

## 2020-04-28 NOTE — Discharge Instructions (Addendum)
No abnormality was found on your work up today except high blood sugar Please restart your metformin You are being started on meloxicam for the chest pain, please stop any other pain medicines such as ibuprofen. Call number on papers and arrange outpatient folllow up asap Return if you are worsening or have new symptoms

## 2020-09-22 IMAGING — CT CT ABDOMEN AND PELVIS WITH CONTRAST
2 of 4 series · 16 of 46 positions shown, 18 images · IV contrast (Omni 300)
Comparison: None.

CLINICAL DATA: Abdominal pain, fever, assess for right lower
quadrant abscess

EXAM:
CT ABDOMEN AND PELVIS WITH CONTRAST
TECHNIQUE: Multidetector CT imaging of the abdomen and pelvis was performed
using the standard protocol following bolus administration of
intravenous contrast.
CONTRAST:  100mL OMNIPAQUE IOHEXOL 300 MG/ML  SOLN

[Series 3: a/p w/ 5mm · axial · 0.95mm/px · z∈[+774,+1220]mm · 13 of 99 slices shown, 15 images]
[im 5/99  soft-tissue]
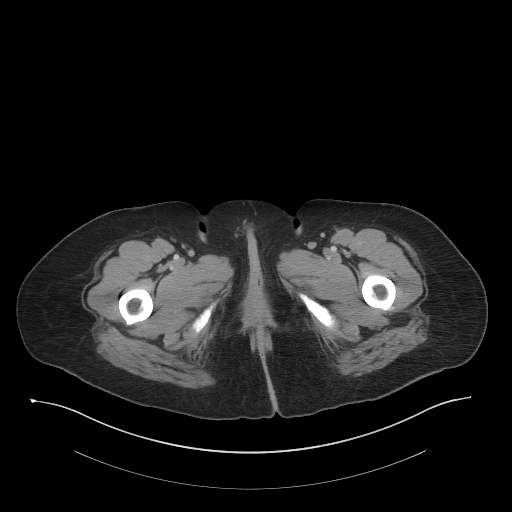
[im 5/99  bone]
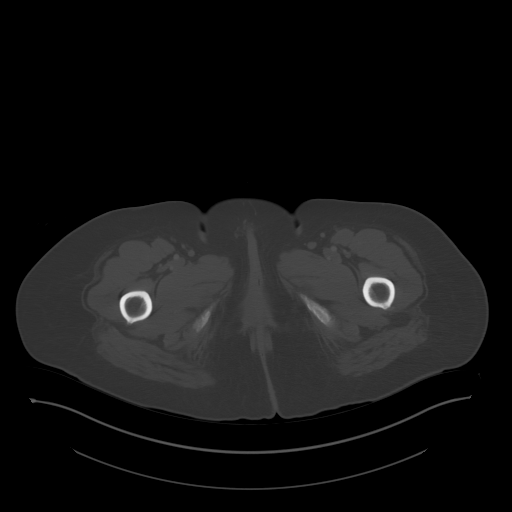
[im 13/99  soft-tissue]
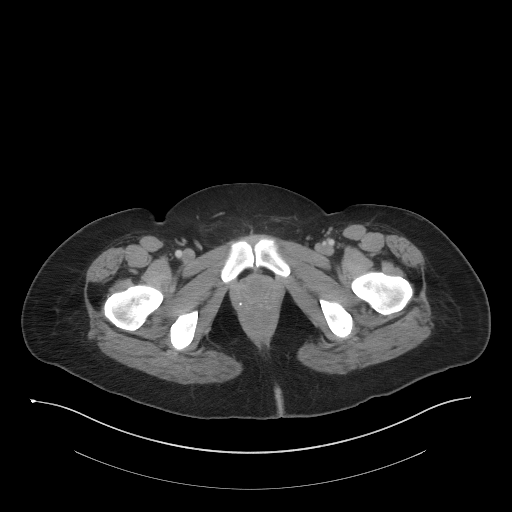
[im 21/99  soft-tissue]
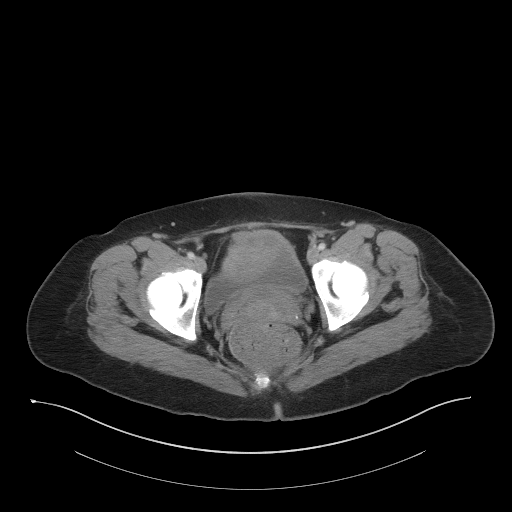
[im 29/99  soft-tissue]
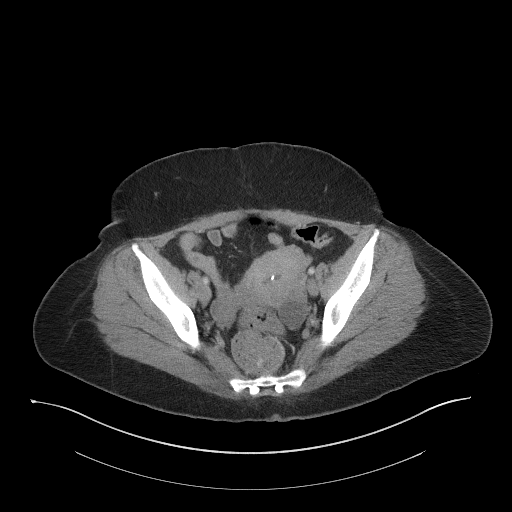
[im 33/99  soft-tissue]
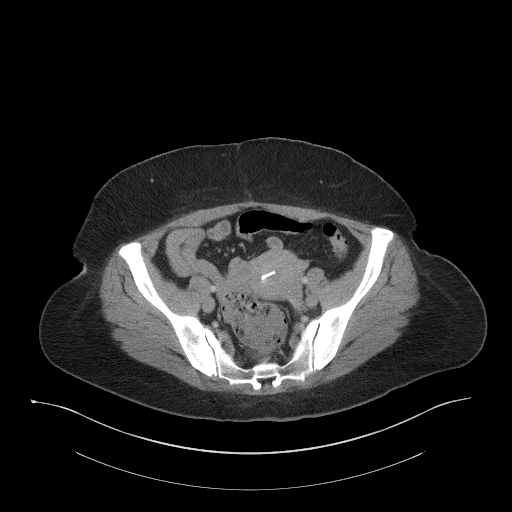
[im 41/99  soft-tissue]
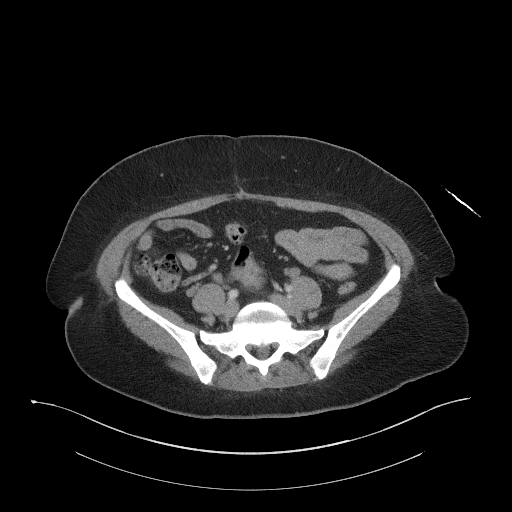
[im 50/99  soft-tissue]
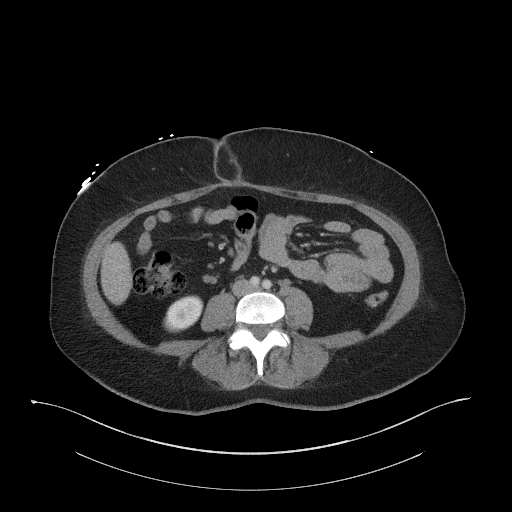
[im 58/99  soft-tissue]
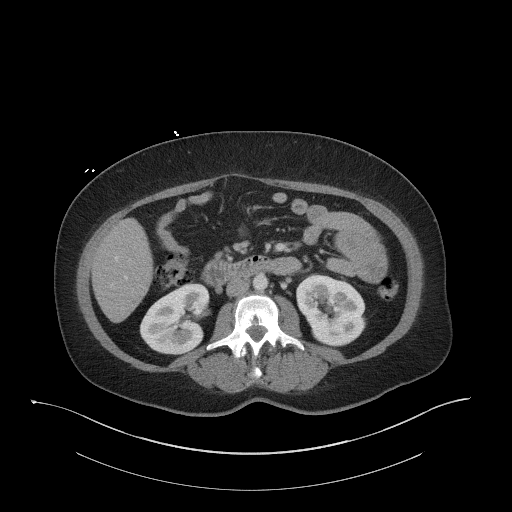
[im 66/99  soft-tissue]
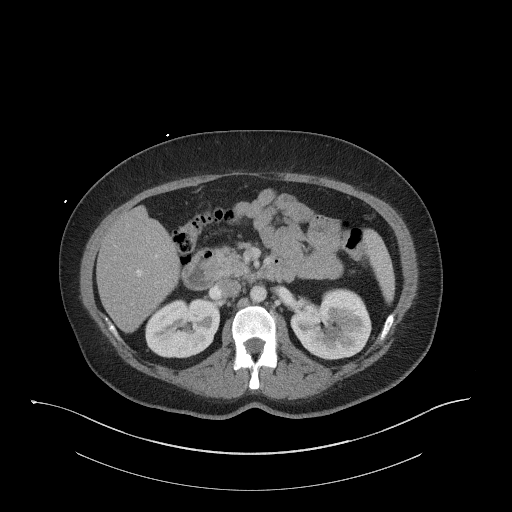
[im 66/99  bone]
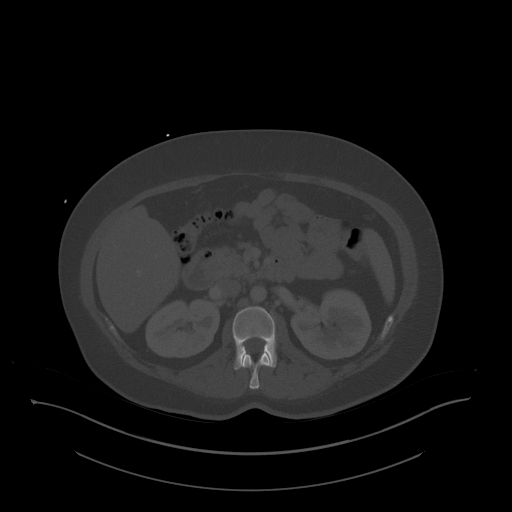
[im 70/99  soft-tissue]
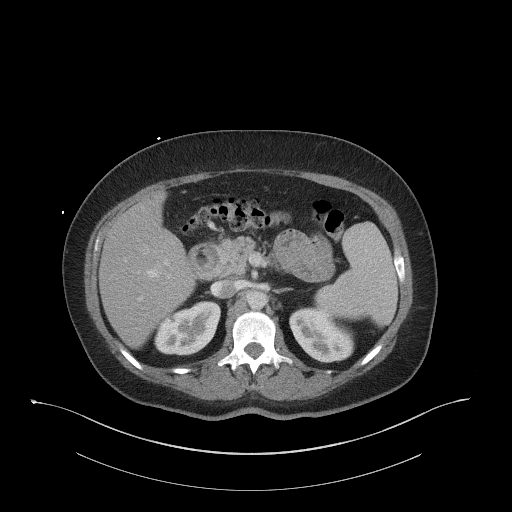
[im 78/99  soft-tissue]
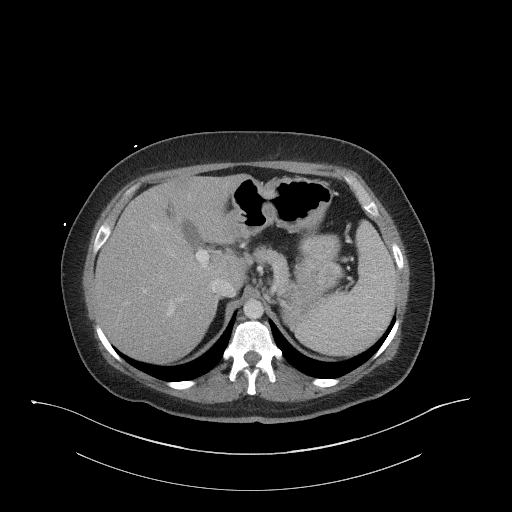
[im 86/99  soft-tissue]
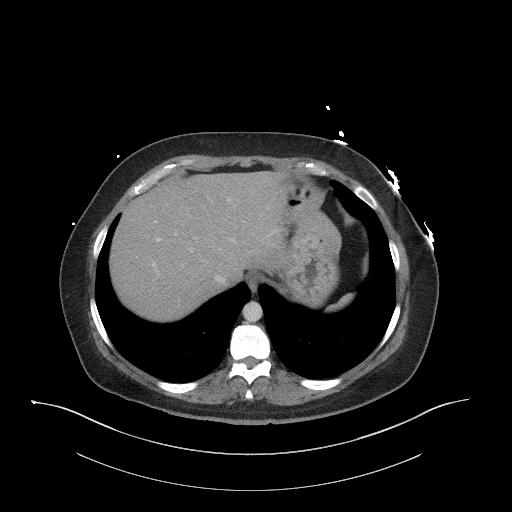
[im 94/99  soft-tissue]
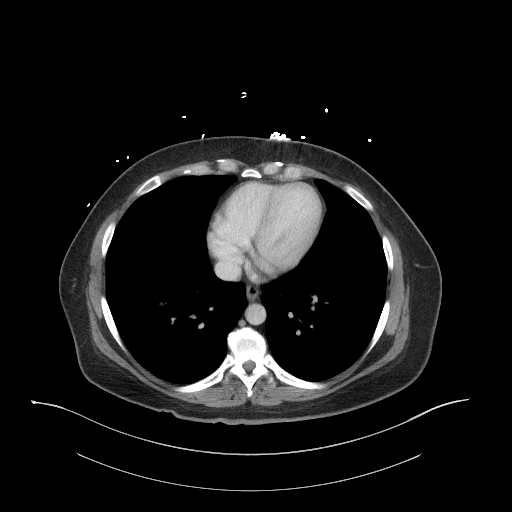

[Series 6: a/p w/ cor · coronal · 0.92mm/px · 3 of 166 slices shown]
[im 56/166  soft-tissue]
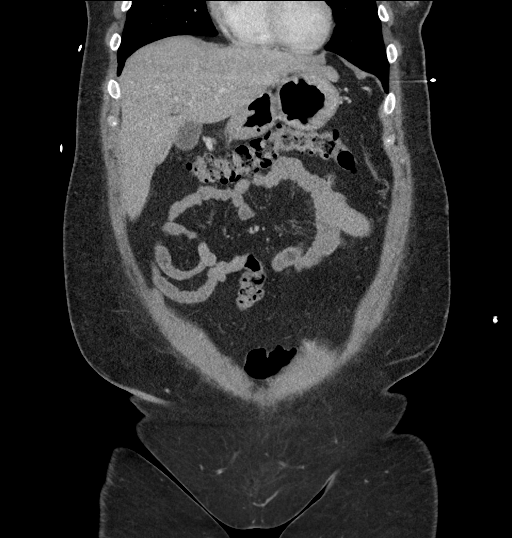
[im 74/166  soft-tissue]
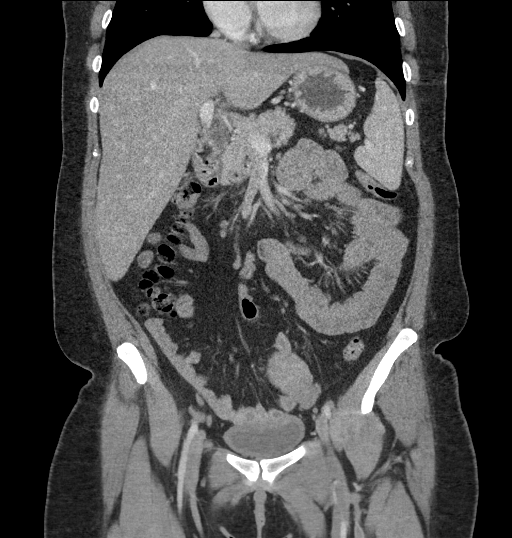
[im 92/166  soft-tissue]
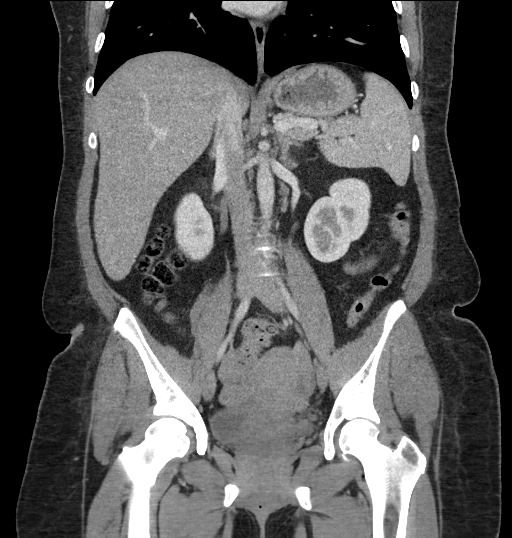

[16 of 46 positions shown; findings below may reference images not displayed]

FINDINGS: Lower chest: Lung bases are clear. Normal heart size. No pericardial
effusion.

Hepatobiliary: No focal liver abnormality is seen. No gallstones,
gallbladder wall thickening, or biliary dilatation.

Pancreas: Unremarkable. No pancreatic ductal dilatation or
surrounding inflammatory changes.

Spleen: Borderline splenomegaly measuring up to 13 cm. No focal
splenic abnormality.

Adrenals/Urinary Tract: Adrenal glands are unremarkable. Kidneys are
normal, without renal calculi, focal lesion, or hydronephrosis. Mild
circumferential bladder wall thickening greater than expected for
the degree of distention.

Stomach/Bowel: Distal esophagus, stomach and duodenal sweep are
unremarkable. No bowel wall thickening or dilatation. No evidence of
obstruction. A normal appendix is visualized. Scattered colonic
diverticula without focal pericolonic inflammation to suggest
diverticulitis.

Vascular/Lymphatic: The aorta is normal caliber. No suspicious or
enlarged lymph nodes in the included lymphatic chains.

Reproductive: The uterus is retroflexed. Radiopaque IUD in expected
position. Punctate myometrial calcification, nonspecific though
almost certainly benign. Normal follicles.

Other: No abdominopelvic free fluid or free gas. No bowel containing
hernias. Fat containing umbilical hernia with a 9 x 9 mm fascial
defect. Small fat containing inguinal hernias.

Musculoskeletal: No acute osseous abnormality or suspicious osseous
lesion.
IMPRESSION: 1. Mild circumferential bladder wall thickening greater than
expected for the degree of distention. Correlate with urinalysis to
exclude cystitis.
2. No other acute intra-abdominal process. Specifically, normal
appendix and no right lower quadrant abscess.
3. Borderline splenomegaly measuring up to 13 cm.

## 2021-09-24 IMAGING — CR DG CHEST 2V
2 series · 2 of 2 positions shown · non-contrast
Comparison: August 05, 2019

CLINICAL DATA: Fall and chest pain

EXAM:
CHEST - 2 VIEW

[chest pa]
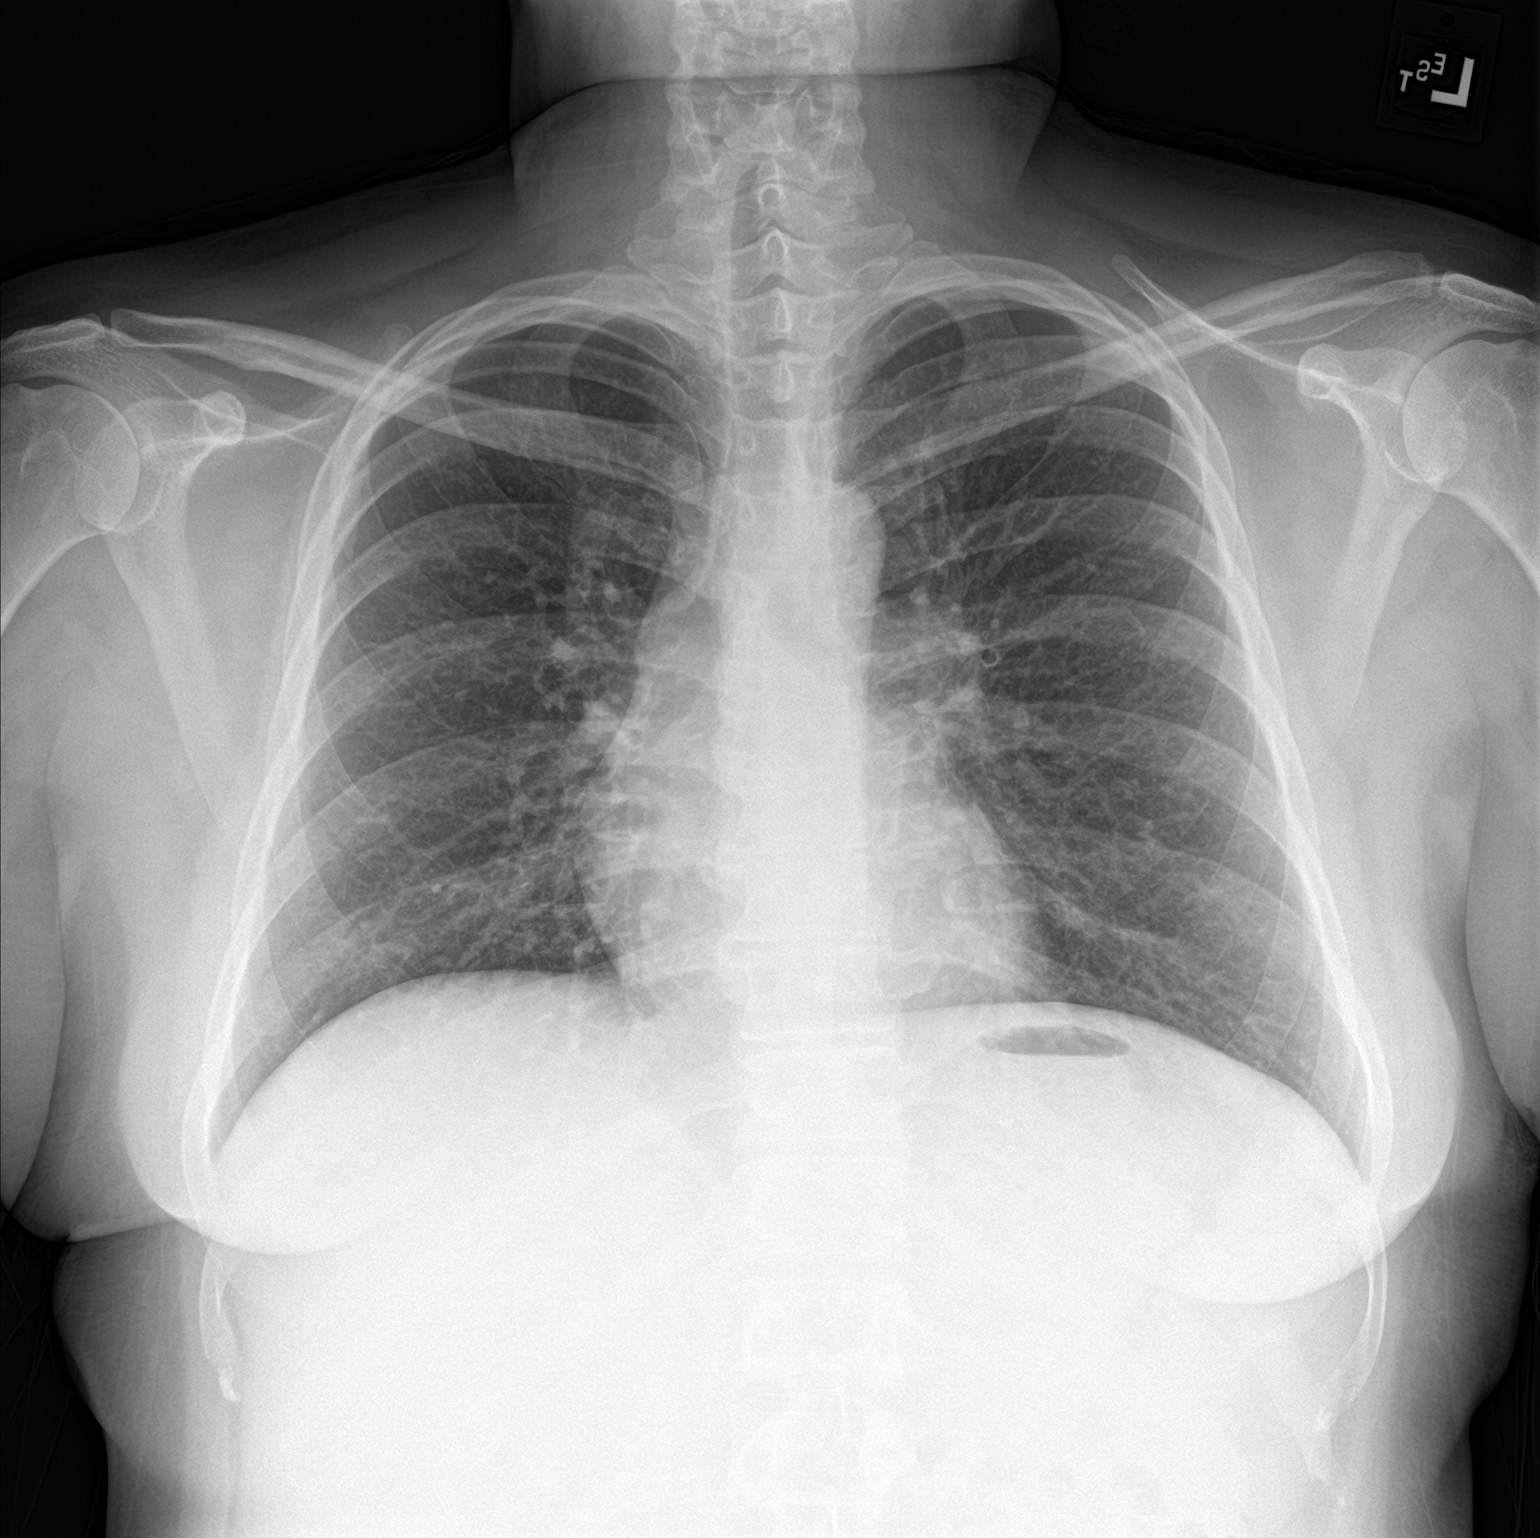

[chest lat]
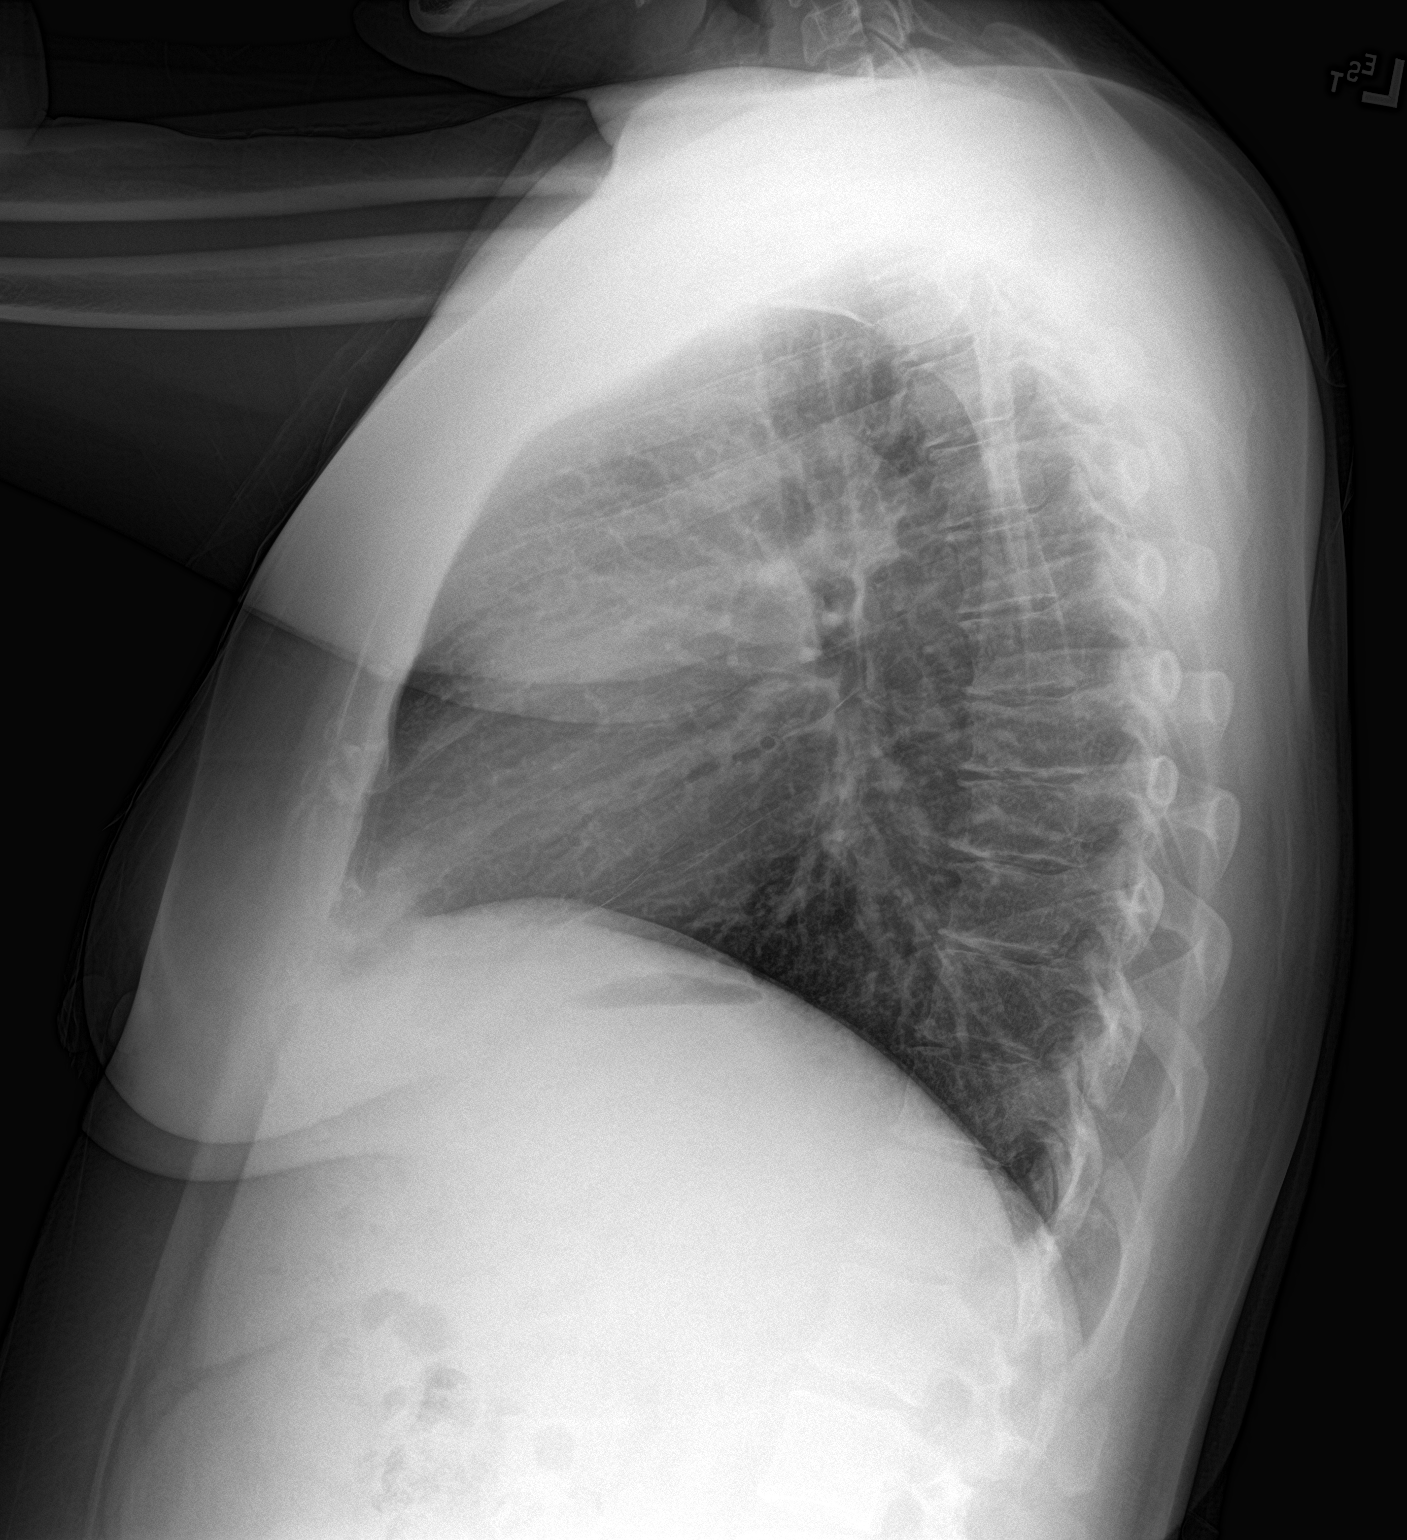

[2 of 2 positions shown; findings below may reference images not displayed]

FINDINGS: The heart size and mediastinal contours are within normal limits.
Both lungs are clear. The visualized skeletal structures are
unremarkable.
IMPRESSION: No active cardiopulmonary disease.
